# Patient Record
Sex: Male | Born: 2000 | Hispanic: Yes | Marital: Single | Smoking: Never smoker
Health system: Southern US, Community
[De-identification: ages and names within clinical notes are randomized; demographics above are authoritative.]

---
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
## 2023-01-23 IMAGING — US US EXTREM LOW VENOUS
1 series · 14 of 24 positions shown · non-contrast
Comparison: None.

CLINICAL DATA: Bilateral leg pain for 1 day

EXAM:
BILATERAL LOWER EXTREMITY VENOUS DOPPLER ULTRASOUND
TECHNIQUE: Gray-scale sonography with compression, as well as color and duplex
ultrasound, were performed to evaluate the deep venous system(s)
from the level of the common femoral vein through the popliteal and
proximal calf veins.

[Series 1: us venous img lower bilat (dvt) · portal-venous · 14 of 58 slices shown]
[im 1/58]
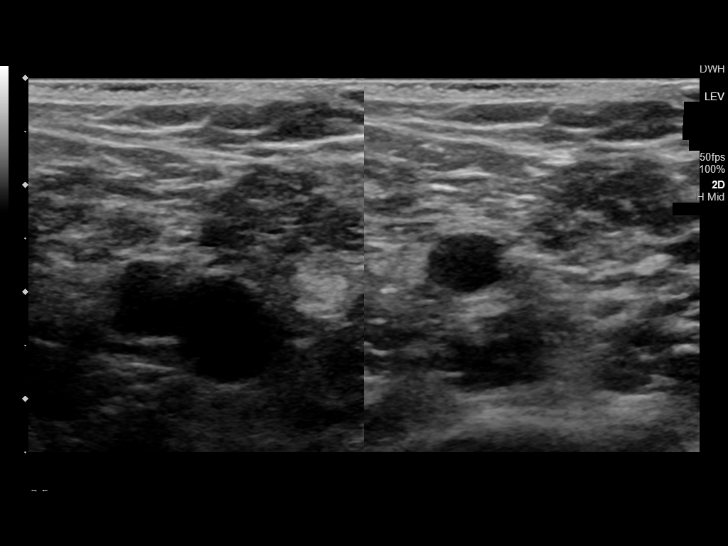
[im 5/58]
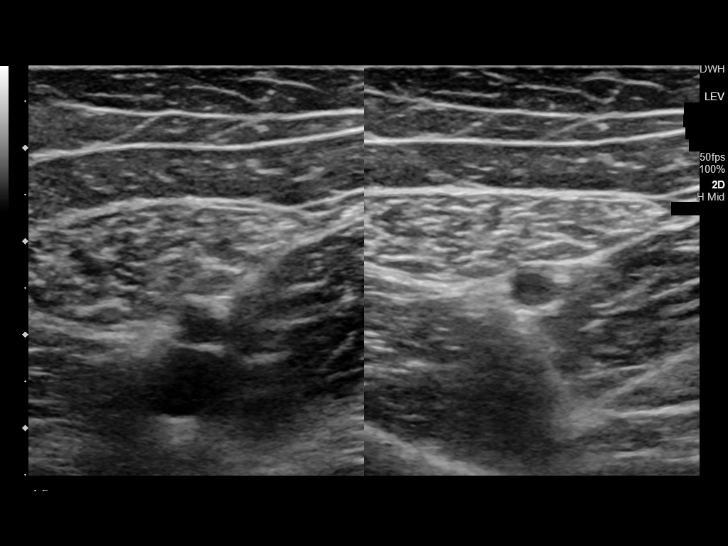
[im 10/58]
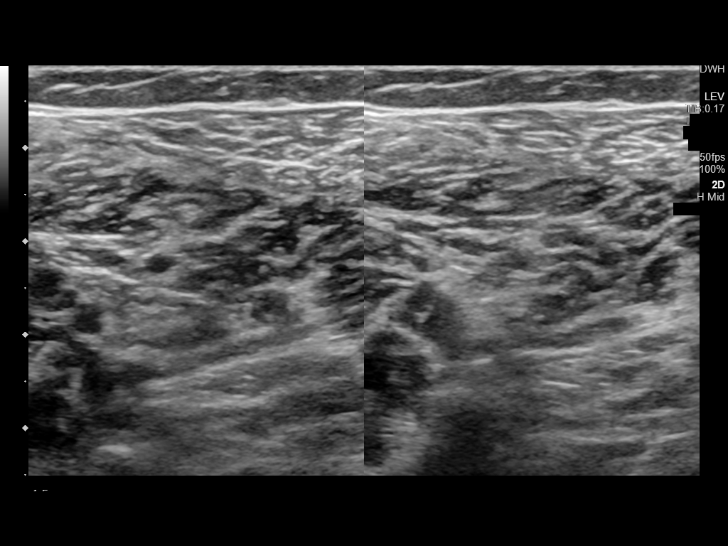
[im 15/58]
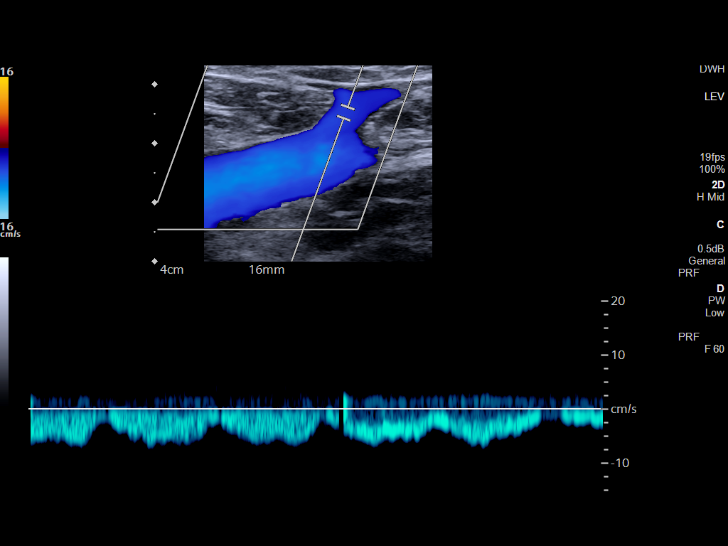
[im 18/58]
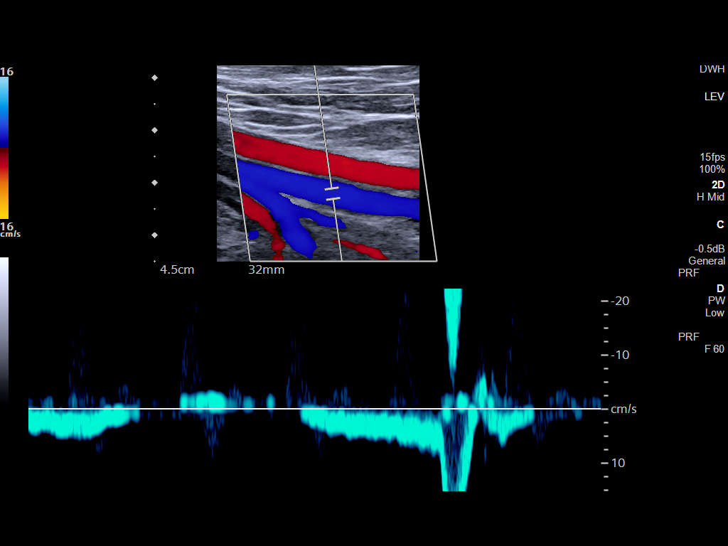
[im 23/58]
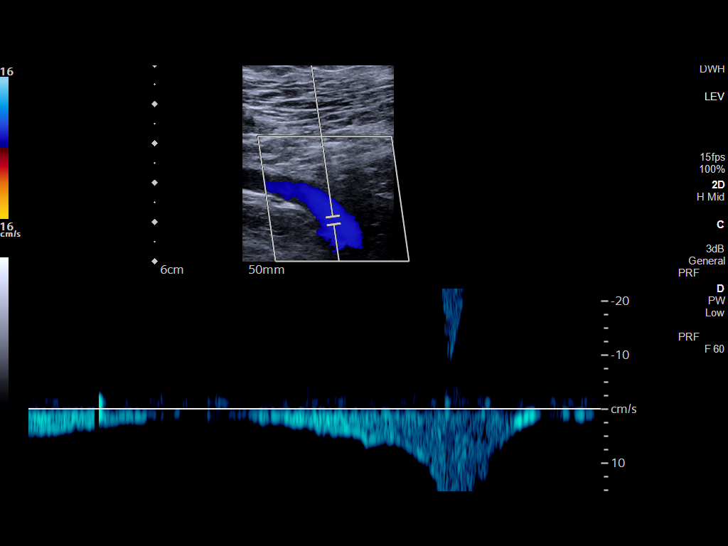
[im 28/58]
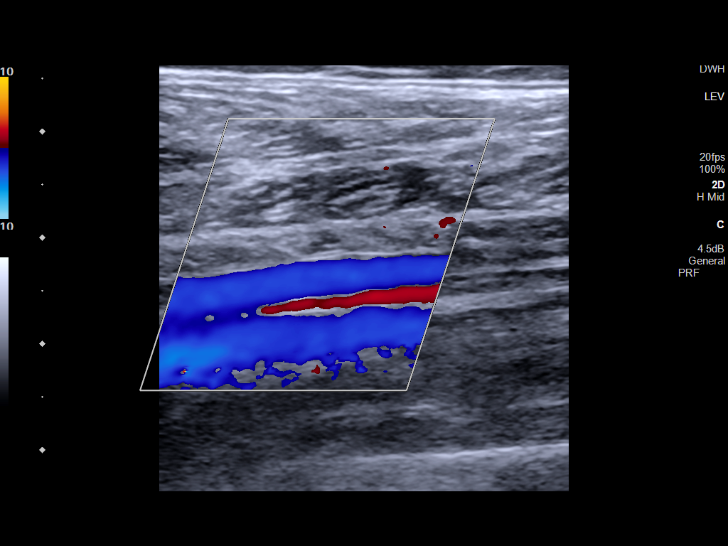
[im 30/58]
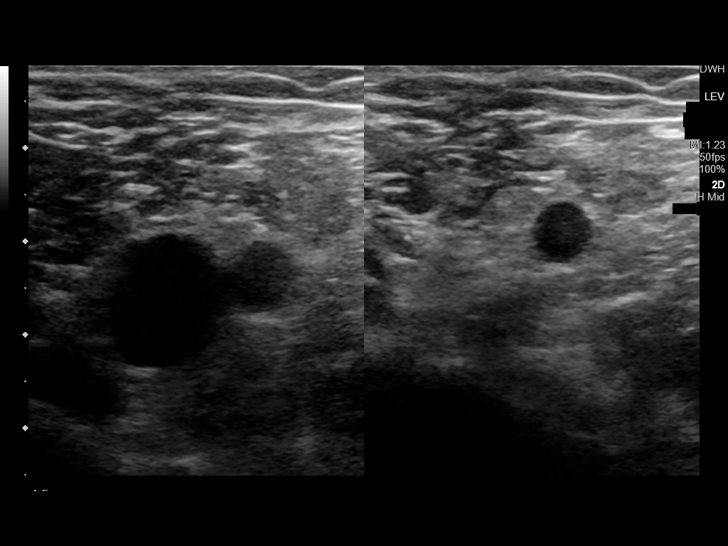
[im 35/58]
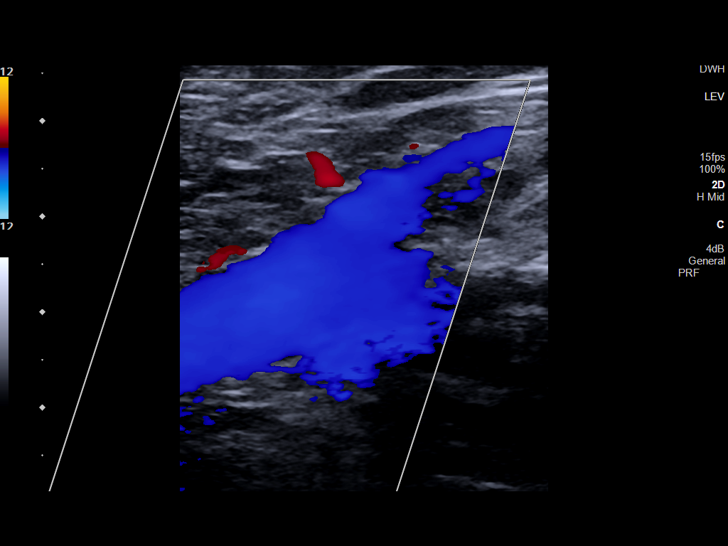
[im 40/58]
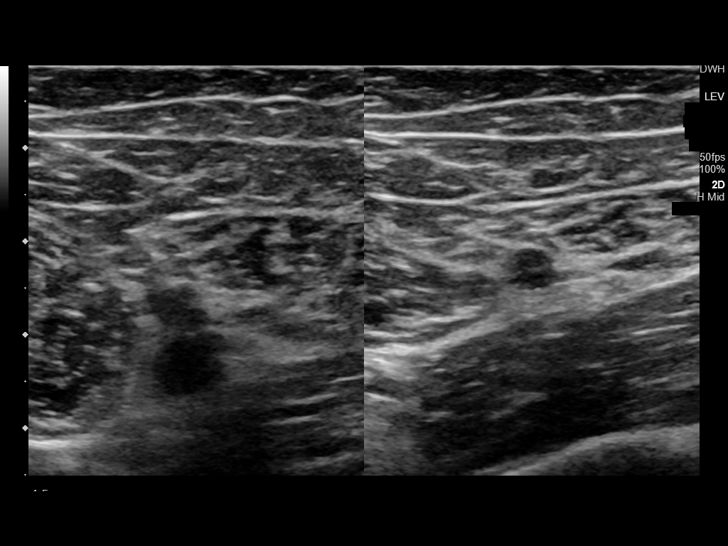
[im 45/58]
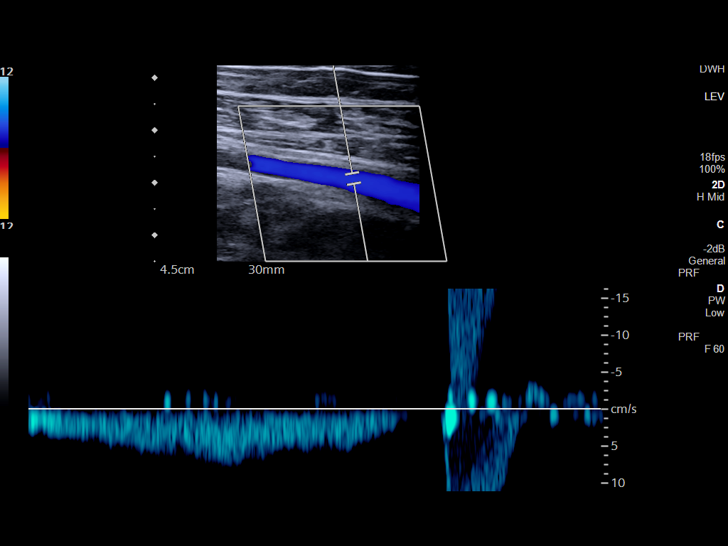
[im 48/58]
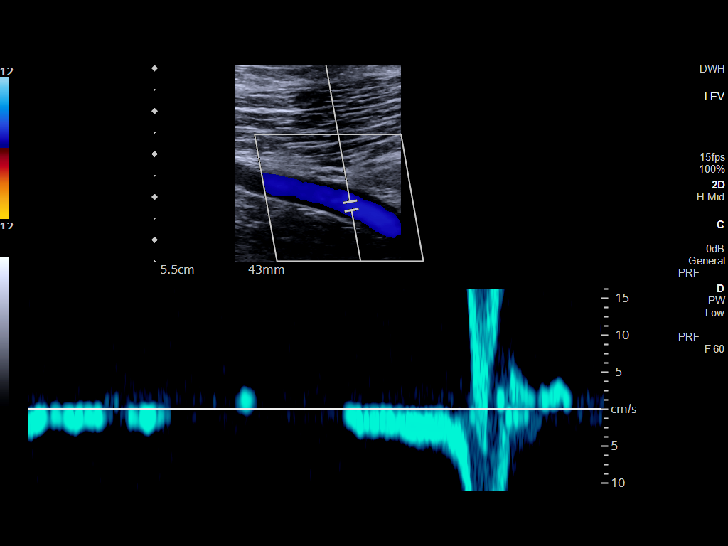
[im 53/58]
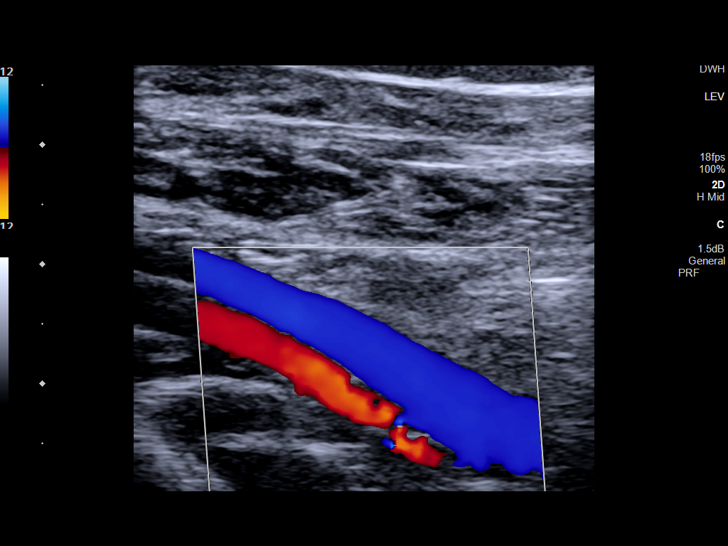
[im 58/58]
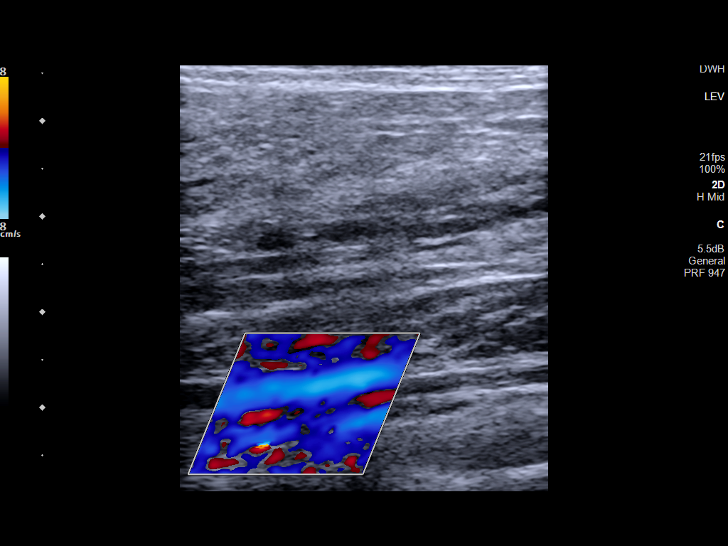

[14 of 24 positions shown; findings below may reference images not displayed]

FINDINGS: VENOUS

Normal compressibility of the common femoral, superficial femoral,
and popliteal veins, as well as the visualized calf veins.
Visualized portions of profunda femoral vein and great saphenous
vein unremarkable. No filling defects to suggest DVT on grayscale or
color Doppler imaging. Doppler waveforms show normal direction of
venous flow, normal respiratory plasticity and response to
augmentation.

Limited views of the contralateral common femoral vein are
unremarkable.

OTHER

None.

Limitations: none
IMPRESSION: No femoropopliteal DVT nor evidence of DVT within the visualized
calf veins of either lower extremity. If clinical symptoms are
inconsistent or if there are persistent or worsening symptoms,
further imaging (possibly involving the iliac veins) may be
warranted.
